# Patient Record
Sex: Female | Born: 1975 | Race: White | Hispanic: No | Marital: Married | Smoking: Never smoker
Health system: Southern US, Community
[De-identification: ages and names within clinical notes are randomized; demographics above are authoritative.]

## PROBLEM LIST (undated history)

## (undated) DIAGNOSIS — R112 Nausea with vomiting, unspecified: Secondary | ICD-10-CM

## (undated) DIAGNOSIS — D649 Anemia, unspecified: Secondary | ICD-10-CM

## (undated) DIAGNOSIS — Z9889 Other specified postprocedural states: Secondary | ICD-10-CM

## (undated) HISTORY — PX: TUBAL LIGATION: SHX77

---

## 2001-04-03 ENCOUNTER — Other Ambulatory Visit: Admission: RE | Admit: 2001-04-03 | Discharge: 2001-04-03 | Payer: Self-pay | Admitting: Gynecology

## 2003-04-29 ENCOUNTER — Encounter: Admission: RE | Admit: 2003-04-29 | Discharge: 2003-07-28 | Payer: Self-pay | Admitting: General Surgery

## 2003-08-11 ENCOUNTER — Emergency Department (HOSPITAL_COMMUNITY): Admission: EM | Admit: 2003-08-11 | Discharge: 2003-08-11 | Payer: Self-pay | Admitting: Emergency Medicine

## 2004-06-07 HISTORY — PX: CHOLECYSTECTOMY: SHX55

## 2010-06-07 HISTORY — PX: HERNIA REPAIR: SHX51

## 2010-06-27 ENCOUNTER — Encounter: Payer: Self-pay | Admitting: General Surgery

## 2012-08-15 ENCOUNTER — Other Ambulatory Visit: Payer: Self-pay | Admitting: Obstetrics and Gynecology

## 2012-08-15 ENCOUNTER — Other Ambulatory Visit (HOSPITAL_COMMUNITY)
Admission: RE | Admit: 2012-08-15 | Discharge: 2012-08-15 | Disposition: A | Discharge status: Home / Self Care | Payer: PRIVATE HEALTH INSURANCE | Source: Ambulatory Visit | Attending: Obstetrics and Gynecology | Admitting: Obstetrics and Gynecology

## 2012-08-15 ENCOUNTER — Ambulatory Visit
Admission: RE | Admit: 2012-08-15 | Discharge: 2012-08-15 | Disposition: A | Discharge status: Home / Self Care | Payer: PRIVATE HEALTH INSURANCE | Source: Ambulatory Visit | Attending: Obstetrics and Gynecology | Admitting: Obstetrics and Gynecology

## 2012-08-15 DIAGNOSIS — M79605 Pain in left leg: Secondary | ICD-10-CM

## 2012-08-15 DIAGNOSIS — R609 Edema, unspecified: Secondary | ICD-10-CM

## 2012-08-15 DIAGNOSIS — Z01419 Encounter for gynecological examination (general) (routine) without abnormal findings: Secondary | ICD-10-CM | POA: Insufficient documentation

## 2012-08-15 DIAGNOSIS — Z1151 Encounter for screening for human papillomavirus (HPV): Secondary | ICD-10-CM | POA: Insufficient documentation

## 2012-08-30 ENCOUNTER — Other Ambulatory Visit: Payer: Self-pay | Admitting: Obstetrics and Gynecology

## 2013-02-13 ENCOUNTER — Other Ambulatory Visit: Payer: Self-pay | Admitting: Obstetrics and Gynecology

## 2013-02-13 DIAGNOSIS — N926 Irregular menstruation, unspecified: Secondary | ICD-10-CM

## 2013-02-14 ENCOUNTER — Ambulatory Visit
Admission: RE | Admit: 2013-02-14 | Discharge: 2013-02-14 | Disposition: A | Discharge status: Home / Self Care | Payer: PRIVATE HEALTH INSURANCE | Source: Ambulatory Visit | Attending: Obstetrics and Gynecology | Admitting: Obstetrics and Gynecology

## 2013-02-14 DIAGNOSIS — N926 Irregular menstruation, unspecified: Secondary | ICD-10-CM

## 2013-03-02 ENCOUNTER — Encounter (HOSPITAL_COMMUNITY): Payer: Self-pay | Admitting: Pharmacist

## 2013-03-06 ENCOUNTER — Encounter (HOSPITAL_COMMUNITY): Payer: Self-pay | Admitting: Anesthesiology

## 2013-03-06 ENCOUNTER — Ambulatory Visit (HOSPITAL_COMMUNITY)
Admission: RE | Admit: 2013-03-06 | Discharge: 2013-03-06 | Disposition: A | Discharge status: Home / Self Care | Payer: Self-pay | Source: Ambulatory Visit | Attending: Obstetrics and Gynecology | Admitting: Obstetrics and Gynecology

## 2013-03-06 ENCOUNTER — Ambulatory Visit (HOSPITAL_COMMUNITY): Payer: PRIVATE HEALTH INSURANCE | Admitting: Anesthesiology

## 2013-03-06 ENCOUNTER — Encounter (HOSPITAL_COMMUNITY)
Admission: RE | Disposition: A | Discharge status: Home / Self Care | Payer: Self-pay | Source: Ambulatory Visit | Attending: Obstetrics and Gynecology

## 2013-03-06 DIAGNOSIS — N938 Other specified abnormal uterine and vaginal bleeding: Secondary | ICD-10-CM | POA: Insufficient documentation

## 2013-03-06 DIAGNOSIS — N920 Excessive and frequent menstruation with regular cycle: Secondary | ICD-10-CM | POA: Insufficient documentation

## 2013-03-06 DIAGNOSIS — N949 Unspecified condition associated with female genital organs and menstrual cycle: Secondary | ICD-10-CM | POA: Insufficient documentation

## 2013-03-06 DIAGNOSIS — N946 Dysmenorrhea, unspecified: Secondary | ICD-10-CM | POA: Insufficient documentation

## 2013-03-06 HISTORY — DX: Anemia, unspecified: D64.9

## 2013-03-06 HISTORY — PX: HYSTEROSCOPY W/D&C: SHX1775

## 2013-03-06 HISTORY — DX: Other specified postprocedural states: R11.2

## 2013-03-06 HISTORY — DX: Other specified postprocedural states: Z98.890

## 2013-03-06 LAB — CBC
HCT: 31.4 % — ABNORMAL LOW (ref 36.0–46.0)
MCH: 29.6 pg (ref 26.0–34.0)
MCHC: 32.2 g/dL (ref 30.0–36.0)
RDW: 14.8 % (ref 11.5–15.5)
WBC: 15.9 10*3/uL — ABNORMAL HIGH (ref 4.0–10.5)

## 2013-03-06 LAB — PREGNANCY, URINE: Preg Test, Ur: NEGATIVE

## 2013-03-06 SURGERY — DILATATION AND CURETTAGE /HYSTEROSCOPY
Anesthesia: General | Site: Vagina | Wound class: Clean Contaminated

## 2013-03-06 MED ORDER — ONDANSETRON HCL 4 MG/2ML IJ SOLN
INTRAMUSCULAR | Status: DC | PRN
  Administered 2013-03-06: 4 mg via INTRAVENOUS

## 2013-03-06 MED ORDER — LIDOCAINE HCL (CARDIAC) 20 MG/ML IV SOLN
INTRAVENOUS | Status: AC
  Filled 2013-03-06: qty 5

## 2013-03-06 MED ORDER — LIDOCAINE HCL (CARDIAC) 20 MG/ML IV SOLN
INTRAVENOUS | Status: DC | PRN
  Administered 2013-03-06: 70 mg via INTRAVENOUS

## 2013-03-06 MED ORDER — LACTATED RINGERS IV SOLN
INTRAVENOUS | Status: DC
  Administered 2013-03-06 (×2): via INTRAVENOUS

## 2013-03-06 MED ORDER — MIDAZOLAM HCL 2 MG/2ML IJ SOLN
INTRAMUSCULAR | Status: AC
  Filled 2013-03-06: qty 2

## 2013-03-06 MED ORDER — DEXAMETHASONE SODIUM PHOSPHATE 10 MG/ML IJ SOLN
INTRAMUSCULAR | Status: DC | PRN
  Administered 2013-03-06: 10 mg via INTRAVENOUS

## 2013-03-06 MED ORDER — SILVER NITRATE-POT NITRATE 75-25 % EX MISC
CUTANEOUS | Status: AC
  Filled 2013-03-06: qty 2

## 2013-03-06 MED ORDER — METOCLOPRAMIDE HCL 5 MG/ML IJ SOLN: 10.0000 mg | Freq: Once | INTRAMUSCULAR | Status: DC | PRN

## 2013-03-06 MED ORDER — FENTANYL CITRATE 0.05 MG/ML IJ SOLN
INTRAMUSCULAR | Status: AC
  Filled 2013-03-06: qty 2

## 2013-03-06 MED ORDER — SCOPOLAMINE 1 MG/3DAYS TD PT72
MEDICATED_PATCH | TRANSDERMAL | Status: AC
  Filled 2013-03-06: qty 1

## 2013-03-06 MED ORDER — PROPOFOL 10 MG/ML IV EMUL
INTRAVENOUS | Status: AC
  Filled 2013-03-06: qty 20

## 2013-03-06 MED ORDER — SCOPOLAMINE 1 MG/3DAYS TD PT72: 1.0000 | MEDICATED_PATCH | TRANSDERMAL | Status: DC

## 2013-03-06 MED ORDER — FENTANYL CITRATE 0.05 MG/ML IJ SOLN
25.0000 ug | INTRAMUSCULAR | Status: DC | PRN
  Administered 2013-03-06: 50 ug via INTRAVENOUS

## 2013-03-06 MED ORDER — LIDOCAINE HCL 2 % IJ SOLN
INTRAMUSCULAR | Status: DC | PRN
  Administered 2013-03-06: 10 mL

## 2013-03-06 MED ORDER — DEXTROSE 5 % IV SOLN
3.0000 g | INTRAVENOUS | Status: AC
  Administered 2013-03-06: 3 g via INTRAVENOUS
  Filled 2013-03-06: qty 3000

## 2013-03-06 MED ORDER — DEXAMETHASONE SODIUM PHOSPHATE 10 MG/ML IJ SOLN
INTRAMUSCULAR | Status: AC
  Filled 2013-03-06: qty 1

## 2013-03-06 MED ORDER — FENTANYL CITRATE 0.05 MG/ML IJ SOLN
INTRAMUSCULAR | Status: DC | PRN
  Administered 2013-03-06 (×2): 50 ug via INTRAVENOUS

## 2013-03-06 MED ORDER — MIDAZOLAM HCL 2 MG/2ML IJ SOLN
INTRAMUSCULAR | Status: DC | PRN
  Administered 2013-03-06: 1 mg via INTRAVENOUS

## 2013-03-06 MED ORDER — GLYCINE 1.5 % IR SOLN
Status: DC | PRN
  Administered 2013-03-06: 3000 mL

## 2013-03-06 MED ORDER — SODIUM CHLORIDE 0.9 % IR SOLN: Status: DC | PRN

## 2013-03-06 MED ORDER — PROPOFOL 10 MG/ML IV BOLUS
INTRAVENOUS | Status: DC | PRN
  Administered 2013-03-06: 180 mg via INTRAVENOUS

## 2013-03-06 MED ORDER — HYDROCODONE-ACETAMINOPHEN 5-325 MG PO TABS: 1.0000 | ORAL_TABLET | Freq: Four times a day (QID) | ORAL | Status: DC | PRN

## 2013-03-06 MED ORDER — KETOROLAC TROMETHAMINE 30 MG/ML IJ SOLN
INTRAMUSCULAR | Status: DC | PRN
  Administered 2013-03-06: 30 mg via INTRAVENOUS

## 2013-03-06 MED ORDER — KETOROLAC TROMETHAMINE 30 MG/ML IJ SOLN: 15.0000 mg | Freq: Once | INTRAMUSCULAR | Status: DC | PRN

## 2013-03-06 MED ORDER — FENTANYL CITRATE 0.05 MG/ML IJ SOLN
INTRAMUSCULAR | Status: AC
  Administered 2013-03-06: 50 ug via INTRAVENOUS
  Filled 2013-03-06: qty 2

## 2013-03-06 MED ORDER — LIDOCAINE HCL 2 % IJ SOLN
INTRAMUSCULAR | Status: AC
  Filled 2013-03-06: qty 20

## 2013-03-06 MED ORDER — KETOROLAC TROMETHAMINE 30 MG/ML IJ SOLN
INTRAMUSCULAR | Status: AC
  Filled 2013-03-06: qty 1

## 2013-03-06 MED ORDER — ONDANSETRON HCL 4 MG/2ML IJ SOLN
INTRAMUSCULAR | Status: AC
  Filled 2013-03-06: qty 2

## 2013-03-06 SURGICAL SUPPLY — 16 items
CANISTER SUCTION 2500CC (MISCELLANEOUS) ×2 IMPLANT
CATH FOLEY LATEX FREE 14FR (CATHETERS) ×1
CATH FOLEY LF 14FR (CATHETERS) ×1 IMPLANT
CATH ROBINSON RED A/P 16FR (CATHETERS) ×2 IMPLANT
CLOTH BEACON ORANGE TIMEOUT ST (SAFETY) ×2 IMPLANT
CONTAINER PREFILL 10% NBF 60ML (FORM) ×4 IMPLANT
DILATOR CANAL MILEX (MISCELLANEOUS) IMPLANT
DRESSING TELFA 8X3 (GAUZE/BANDAGES/DRESSINGS) ×2 IMPLANT
GLOVE BIO SURGEON STRL SZ7 (GLOVE) ×2 IMPLANT
GLOVE BIOGEL PI IND STRL 7.0 (GLOVE) ×1 IMPLANT
GLOVE BIOGEL PI INDICATOR 7.0 (GLOVE) ×1
GOWN STRL REIN XL XLG (GOWN DISPOSABLE) ×4 IMPLANT
PACK HYSTEROSCOPY LF (CUSTOM PROCEDURE TRAY) ×2 IMPLANT
PAD OB MATERNITY 4.3X12.25 (PERSONAL CARE ITEMS) ×2 IMPLANT
TOWEL OR 17X24 6PK STRL BLUE (TOWEL DISPOSABLE) ×4 IMPLANT
WATER STERILE IRR 1000ML POUR (IV SOLUTION) ×2 IMPLANT

## 2013-03-06 NOTE — H&P (Signed)
History of Present Illness  General:  37 y/o morbidly obese female with DUB presents with compliaint of heavy bleeding. Would like to proceed with D&C ASAP. Changing pads every 1 per hour prior to increasing Provera to 20 mg 2 days ago. Now only changing once every 3 hours yesterdays. +Clots, golf ball sized. + Flooding. Fatigued, SOB, Dizzy. Hg yesterday was 9.9. On Provera 10 mg x 2.5 weeks, since 02/13/13. Has had times where blood is running down her leg. Desires to proceed with Thermochoice ablation. Pt just found out she is uninsured and is aware that cost will be due upfront. Would like to talk with her husband first.  Current Medications  Taking Multivitamins Tablet   Taking Naproxen Sodium 220 MG Tablet 1 tablet as needed every 12 hrs, Notes: prn for headaches  Taking Provera 20 Tablet 1 table One tablet daiy  Taking Integra 62.5-62.5-40-3 MG Capsule 1 capsule Once a day  Not-Taking/PRN ibuprofen 1 tab , Notes: prn  Not-Taking/PRN Tylenol 1 tab , Notes: prn  Medication List reviewed and reconciled with the patient  Past Medical History  No Medical History.  Surgical History  c-section x 2   gallbladder removed   incisional hernia repaired   BTL   Family History  Father: alive, skin cancer  Mother: alive, MI  Maternal Grand Mother: deceased, diagnosed with Breast Ca  Social History  General:  Tobacco use  cigarettes: Never smoked no Smoking.  Alcohol: occasionally.  no Recreational drug use.  Exercise: minimal.  Occupation: House wife.  Marital Status: married.  Children: girls, 2.   Gyn History  Sexual activity currently sexually active.  Periods : irregular.  LMP started in July and has not stopped, some days are very heavy with lots of clotting and other days lighter.  Birth control BTL.  Last pap smear date 08/15/12, neg.  Last mammogram date within the last two years.  Abnormal pap smear none.  STD none.  GYN procedures 08/30/12, Endometrial Biopsy, benign.    OB History  Number of pregnancies 2.  Pregnancy # 1 live birth, C-section delivery, girl.  Pregnancy # 2 live birth, C-section, girl.   Allergies  Dilaudid: hives  Latex Gloves  Hospitalization/Major Diagnostic Procedure  childbirth x 2   Review of Systems  See HPI.  Vital Signs  Wt 372, Ht 65.5, BMI 60.96, BP sitting 154/90.  Physical Examination  GENERAL:  Patient appears alert and oriented.  General Appearance: well-appearing, well-developed, no acute distress.  Speech: clear.  LUNGS:  Auscultation: CTA bilaterally, no wheezing/rhonchi/rales.  HEART:  Heart sounds: normal, RRR.  ABDOMEN:  General: moibidly obese, nontender.  FEMALE GENITOURINARY:  Cervix visualized, healthy appearing, no discharge, no lesions.  Adnexa: no mass, non tender.  Uterus: normal size/shape/consistency, freely mobile, non tender.  Vagina: pink/moist mucosa, no lesions, no abnormal discharge, min-moderate blood in vault..  Vulva: normal, no lesions, no skin discoloration.  Anus: no external hemosrhoids.  EXTREMITIES:  general no edema.    Assessments  1. Pre-op exam - V72.84 (Primary)  2. Menorrhagia - 626.2  3. DUB (dysfunctional uterine bleeding) - 626.8  4. Dysmenorrhea - 625.3  Treatment  1. Pre-op exam  Notes: Proceed with D&C to rule out endometrial hyperplasia and/or cancer. Also can be therapeutic. Pt desires to proceed ASAP. Instructed to go to ER for symptomatica anemia.    2. Menorrhagia  LAB: CBC with Diff (Ordered for 02/28/2013) LAB: Ferritin (Ordered for 02/28/2013) LAB: Iron, Serum (Ordered for 02/28/2013)   3.  DUB (dysfunctional uterine bleeding)  Start Provera Tablet, 10 MG, 3 tablets, Orally, Once a day, 20 days, 60 Tablet, Refills 0   4. Dysmenorrhea  Start Ibuprofen Tablet, 800 MG, 1 tablet, Orally, every 8 hrs x 24 hours prior to menses then q 8 hours prn pain, 30 day(s), 30, Refills 2      Labs  Lab: CBC with Diff (Ordered for 02/28/2013) Hg 9.9  WBC 13.6 H  4.0-11.0 - K/ul  RBC 3.37 L 4.20-5.40 - M/uL  HGB 9.9 L 12.0-16.0 - g/dL  HCT 74.2 L 59.5-63.8 - %  MCH 29.4  27.0-33.0 - pg  MPV 8.9  7.5-10.7 - fL  MCV 91.8  81.0-99.0 - fL  MCHC 32.0  32.0-36.0 - g/dL  RDW 75.6  43.3-29.5 - %  NRBC# 0.00  -   PLT 297  150-400 - K/uL  NEUT % 65.1  43.3-71.9 - %  NRBC% 0.00  - %  LYMPH% 25.2  16.8-43.5 - %  MONO % 7.9  4.6-12.4 - %  EOS % 1.6  0.0-7.8 - %  BASO % 0.2  0.0-1.0 - %  NEUT # 8.9 H 1.9-7.2 - K/uL  LYMPH# 3.40 H 1.10-2.70 - K/uL  MONO # 1.1 H 0.3-0.8 - K/uL  EOS # 0.2  0.0-0.6 - K/uL  BASO # 0.0  0.0-0.1 - K/uL            Lab: Ferritin (Ordered for 02/28/2013)  FERRITIN 26.6  23.9-336.2 - ng/mL            Lab: Iron, Serum (Ordered for 02/28/2013)  IR 60   50-212 - ug/dL             Follow Up  2 Weeks post op

## 2013-03-06 NOTE — Transfer of Care (Signed)
Immediate Anesthesia Transfer of Care Note  Patient: Candice Gillespie  Procedure(s) Performed: Procedure(s): DILATATION AND CURETTAGE /HYSTEROSCOPY (N/A)  Patient Location: PACU  Anesthesia Type:General  Level of Consciousness: awake, oriented and patient cooperative  Airway & Oxygen Therapy: Patient Spontanous Breathing and Patient connected to nasal cannula oxygen  Post-op Assessment: Report given to PACU RN and Post -op Vital signs reviewed and stable  Post vital signs: Reviewed and stable  Complications: No apparent anesthesia complications

## 2013-03-06 NOTE — Anesthesia Preprocedure Evaluation (Addendum)
Anesthesia Evaluation  Patient identified by MRN, date of birth, ID band Patient awake    Reviewed: Allergy & Precautions, H&P , NPO status , Patient's Chart, lab work & pertinent test results, reviewed documented beta blocker date and time   History of Anesthesia Complications (+) PONV  Airway Mallampati: II TM Distance: >3 FB Neck ROM: full    Dental  (+) Teeth Intact   Pulmonary neg pulmonary ROS,  breath sounds clear to auscultation  Pulmonary exam normal       Cardiovascular negative cardio ROS  Rhythm:regular Rate:Normal     Neuro/Psych negative neurological ROS  negative psych ROS   GI/Hepatic negative GI ROS, Neg liver ROS,   Endo/Other  Morbid obesity  Renal/GU negative Renal ROS  Female GU complaint     Musculoskeletal   Abdominal   Peds  Hematology  (+) anemia ,   Anesthesia Other Findings Allergic to dilaudid  Reproductive/Obstetrics negative OB ROS                          Anesthesia Physical Anesthesia Plan  ASA: III  Anesthesia Plan: General LMA   Post-op Pain Management:    Induction:   Airway Management Planned:   Additional Equipment:   Intra-op Plan:   Post-operative Plan:   Informed Consent: I have reviewed the patients History and Physical, chart, labs and discussed the procedure including the risks, benefits and alternatives for the proposed anesthesia with the patient or authorized representative who has indicated his/her understanding and acceptance.   Dental Advisory Given  Plan Discussed with: CRNA and Surgeon  Anesthesia Plan Comments:        Anesthesia Quick Evaluation

## 2013-03-06 NOTE — Brief Op Note (Signed)
03/06/2013  8:22 AM  PATIENT:  Trula Slade  37 y.o. female  PRE-OPERATIVE DIAGNOSIS:  ABNORMAL BLEEDING, Morbid obesity  POST-OPERATIVE DIAGNOSIS:  Same  PROCEDURE:  Procedure(s): DILATATION AND CURETTAGE /HYSTEROSCOPY (N/A)  SURGEON:  Surgeon(s) and Role:    * Geryl Rankins, MD - Primary  PHYSICIAN ASSISTANT:   ASSISTANTS: Technician   ANESTHESIA:   local and general  EBL:  Total I/O In: 1300 [I.V.:1300] Out: 25 [Urine:25]  BLOOD ADMINISTERED:none  DRAINS: none   LOCAL MEDICATIONS USED: 2% XYLOCAINE 10 cc  SPECIMEN:  Source of Specimen:  endometrial currettings  DISPOSITION OF SPECIMEN:  PATHOLOGY  COUNTS:  YES  TOURNIQUET:  * No tourniquets in log *  DICTATION: .Other Dictation: Dictation Number [id number]  PLAN OF CARE: Discharge to home after PACU  PATIENT DISPOSITION:  PACU - hemodynamically stable.   Delay start of Pharmacological VTE agent (>24hrs) due to surgical blood loss or risk of bleeding: not applicable

## 2013-03-06 NOTE — Anesthesia Postprocedure Evaluation (Signed)
Anesthesia Post Note  Patient: Museum/gallery conservator  Procedure(s) Performed: Procedure(s) (LRB): DILATATION AND CURETTAGE /HYSTEROSCOPY (N/A)  Anesthesia type: General  Patient location: PACU  Post pain: Pain level controlled  Post assessment: Post-op Vital signs reviewed  Last Vitals:  Filed Vitals:   03/06/13 0944  BP:   Pulse: 72  Temp: 36.6 C  Resp: 16    Post vital signs: Reviewed  Level of consciousness: sedated  Complications: No apparent anesthesia complications

## 2013-03-06 NOTE — Interval H&P Note (Signed)
History and Physical Interval Note:  03/06/2013 7:25 AM  Candice Gillespie  has presented today for surgery, with the diagnosis of ABNORMAL BLEEDING   The various methods of treatment have been discussed with the patient and family. After consideration of risks, benefits and other options for treatment, the patient has consented to  Procedure(s): DILATATION AND CURETTAGE /HYSTEROSCOPY (N/A) as a surgical intervention .  The patient's history has been reviewed, patient examined, no change in status, stable for surgery.  I have reviewed the patient's chart and labs.  Questions were answered to the patient's satisfaction.    Pt states bleeding has slowed down.  Dion Body, Thereasa Iannello

## 2013-03-06 NOTE — Addendum Note (Signed)
Addendum created 03/06/13 1006 by Suella Grove, CRNA   Modules edited: Anesthesia Medication Administration

## 2013-03-07 ENCOUNTER — Encounter (HOSPITAL_COMMUNITY): Payer: Self-pay | Admitting: Obstetrics and Gynecology

## 2013-03-07 NOTE — Op Note (Signed)
Candice Gillespie, LOTTER NO.:  [id number]  MEDICAL RECORD NO.:  [id number]  LOCATION:  [facility]                          FACILITY:  [facility]  PHYSICIAN:  Pieter Partridge, MD   DATE OF BIRTH:  1975-07-05  DATE OF PROCEDURE:  03/06/2013 DATE OF DISCHARGE:  03/06/2013                              OPERATIVE REPORT   PREOPERATIVE DIAGNOSES:  Abnormal uterine bleeding, morbid obesity.  POSTOPERATIVE DIAGNOSES:  Abnormal uterine bleeding, morbid obesity.  PROCEDURE:  Hysteroscopy, dilation and curettage.  ASSISTANT:  Pensions consultant.  ANESTHESIA:  Local and general.  IV FLUIDS:  In 1300.  Out of urine prior to procedure is 25 mL.  ANESTHESIA:  Local 2% Xylocaine 10 mL.  SPECIMEN:  Endometrial curettings to Pathology.  FINDINGS:  Unable to distend the endometrial cavity due to length of hysteroscope, however, appears to be copious amount of endometrial tissue.  DISPOSITION:  To PACU hemodynamically stable.  COMPLICATIONS:  None.  PROCEDURE IN DETAIL:  [name] was identified in the holding area. She was then taken to the operating room, where she was placed in the dorsal supine position and underwent general anesthesia without complication.  She was then placed in the dorsal lithotomy position and prepped and draped in normal sterile fashion.  After a time-out was taken, a long Graves speculum was inserted.  The cervix was then identified and grasped with a single-tooth tenaculum.  Betadine was used to clean off the cervix 3 times.  Paracervical block was performed with 10 mL of Xylocaine 2%.  The Hasson was then used to open the cervix and dilated up to a [employer] 19.  Hysteroscope was then advanced.  Because of the patient's habitus, I used the extra long instruments, unfortunately hysteroscope was not extra long.  The patient was sharply anteverted and I was unable to reach to the fundus to adequately distend the uterine fundus, however, just from the appearance, there  appeared to be a lot of endometrial tissue.  The hysteroscope was then removed.  Sharp curettage was performed of all 4 quadrants and I even used polyp forceps to grasp some of the tissue, which was fruitful.  All instruments were then removed from the vagina. The single-tooth tenaculum site was hemostatic and no silver nitrate was used.  All instruments, sponge, and needle counts were correct x3.  The patient was taken to the operating room and went to the PACU in stable condition.  She had SCDs on throughout the case while operating and she received cefotetan 3 g prior to procedure.     Pieter Partridge, MD     EBV/MEDQ  D:  03/06/2013  T:  03/06/2013  Job:  [id number]

## 2013-03-10 ENCOUNTER — Telehealth (HOSPITAL_COMMUNITY): Payer: Self-pay | Admitting: Obstetrics and Gynecology

## 2013-03-10 NOTE — Telephone Encounter (Signed)
TC from patient's husband--had hysteroscopy/D&C on 9/30 due to abnormal uterine bleeding, with large amount of endometrial thickness noted. No complications with procedure. Husband reports patient had soaked through pad and clothing from 1am to 7am, when she got up to BR. Reports she continues to have cramping, he is unsure how much bleeding she is having at present, since she is in the bathroom "cleaning up". Hgb was 10.1 pre-surgery. I advised him I would call back when patient out of shower--he advises me to call in 30 min.

## 2013-03-10 NOTE — Telephone Encounter (Signed)
Addendum/f/u: Spoke with patient at 9:15am, after she had freshened up from bleeding overnight. Small amount bleeding at present.  Mild cramping. Has been taking Vicodin and Ibuprophen prn, but not frequently. Denies dizziness, syncope, fever, dysuria, or other sx. "Just thought I would have less bleeding after procedure". Reviewed status with patient--advised her I recommended she increase rest today, take Ibuprophen on an routine basis (not prn) x 24 hours, push fluids. She will observe bleeding and cramping today and through weekend--if any increase to soaking a pad q 30 min, or if pain increases, she is to call. Advised her she could be seen at any time at [facility] if she had concerns, but that at present, I felt she was stable. Recommended if she did not require evaluation at [facility] over the weekend, she should contact Dr. Dion Body on Monday to give her an update on her status. Patient agreeable with plan.

## 2013-03-19 ENCOUNTER — Ambulatory Visit: Admit: 2013-03-19 | Payer: PRIVATE HEALTH INSURANCE | Admitting: Obstetrics and Gynecology

## 2013-03-19 SURGERY — DILATATION AND CURETTAGE /HYSTEROSCOPY
Anesthesia: Choice

## 2013-04-12 ENCOUNTER — Other Ambulatory Visit: Payer: Self-pay

## 2014-09-29 IMAGING — US US EXTREM LOW VENOUS*L*
1 series · 14 of 24 positions shown · non-contrast
Comparison: None

CLINICAL DATA: LEFT LEG PAIN AND SWELLING ? DVT;;

LEFT LOWER EXTREMITY VENOUS DUPLEX ULTRASOUND
TECHNIQUE: Gray-scale sonography with compression, as well as color
and duplex ultrasound, were performed to evaluate the deep venous
system from the level of the common femoral vein through the
popliteal and proximal calf veins.

[Series 1: us extrem low venous*left* · 14 of 29 slices shown]
[im 1/29]
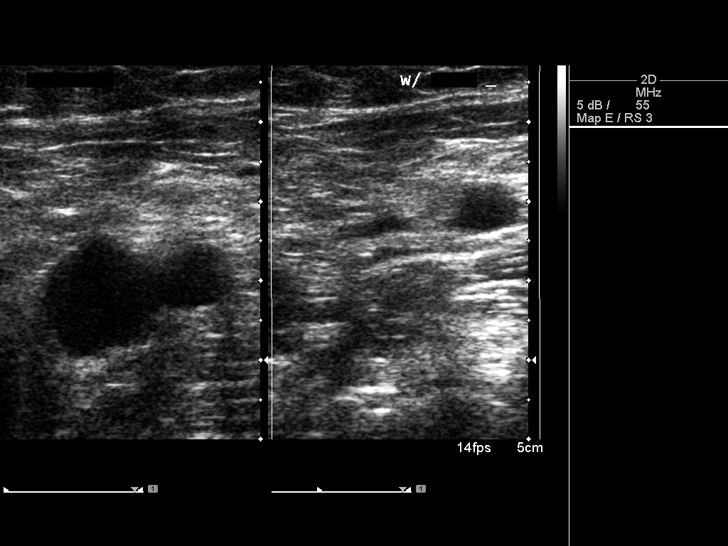
[im 3/29]
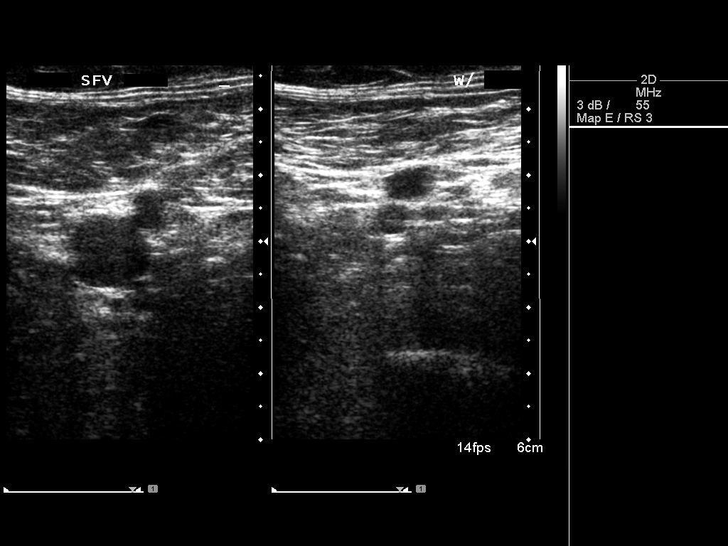
[im 5/29]
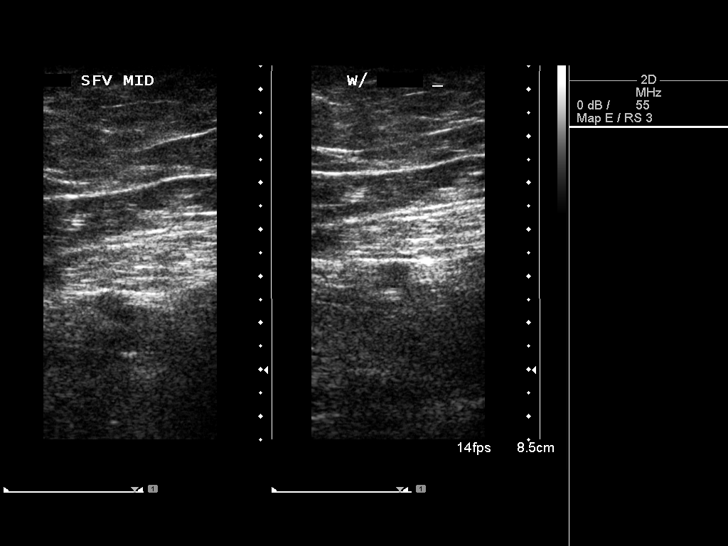
[im 8/29]
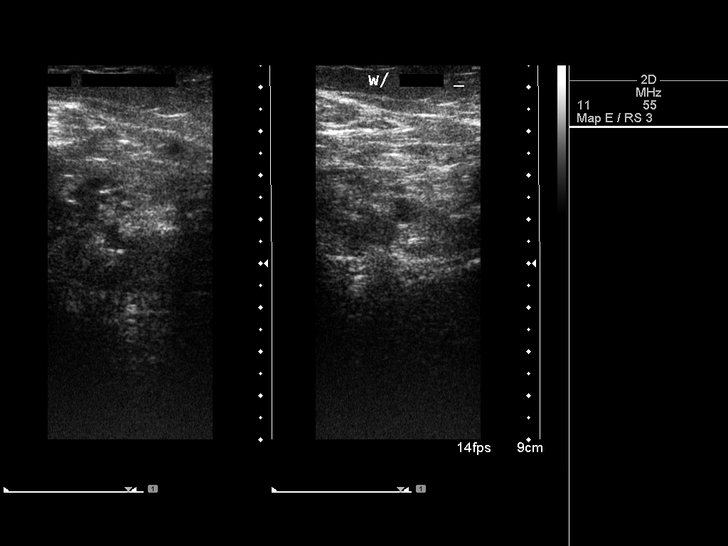
[im 9/29]
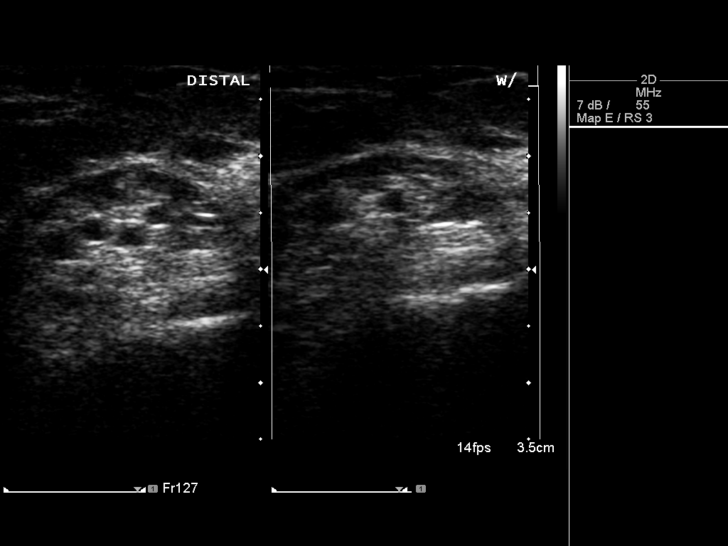
[im 11/29]
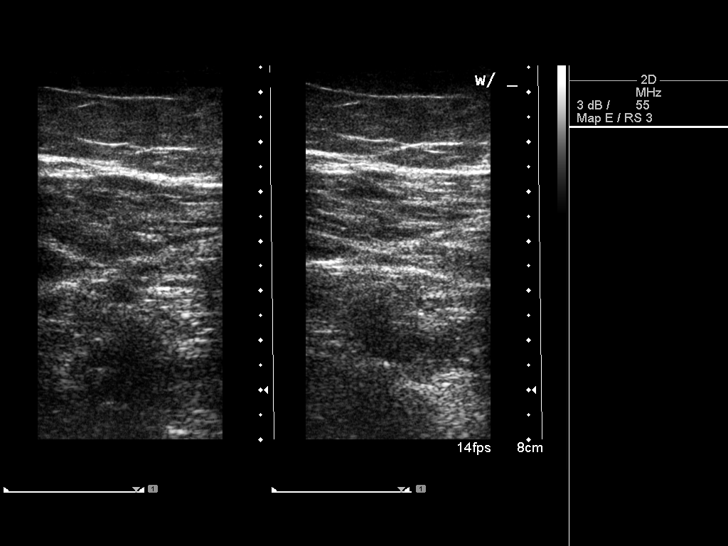
[im 14/29]
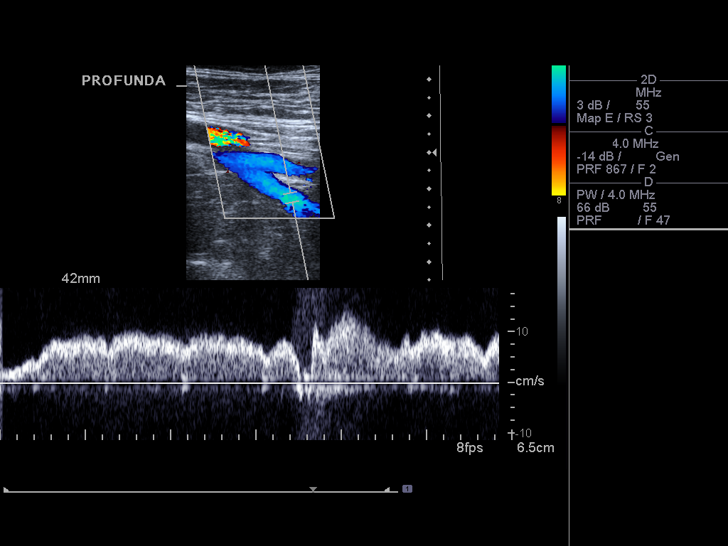
[im 15/29]
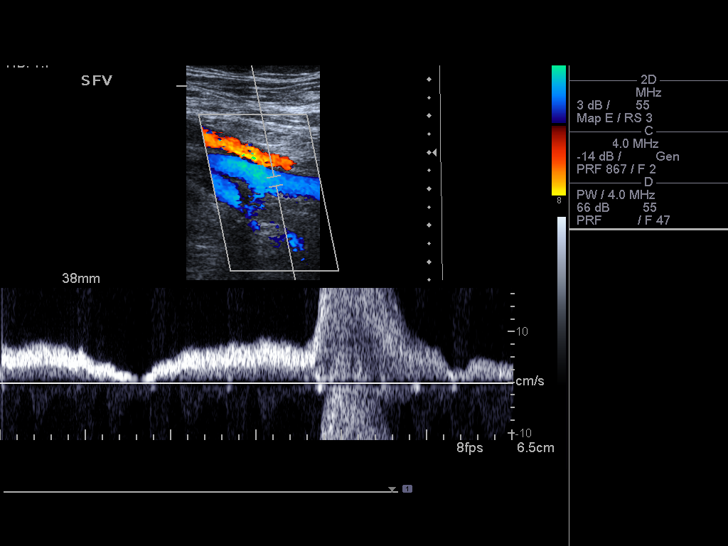
[im 18/29]
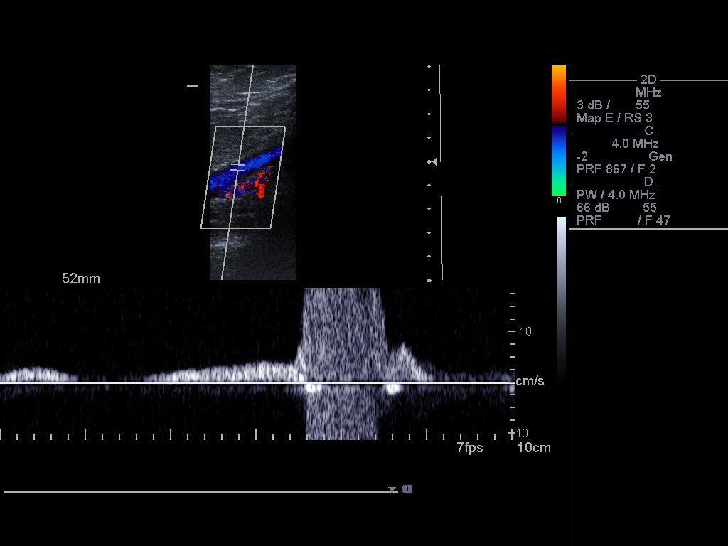
[im 20/29]
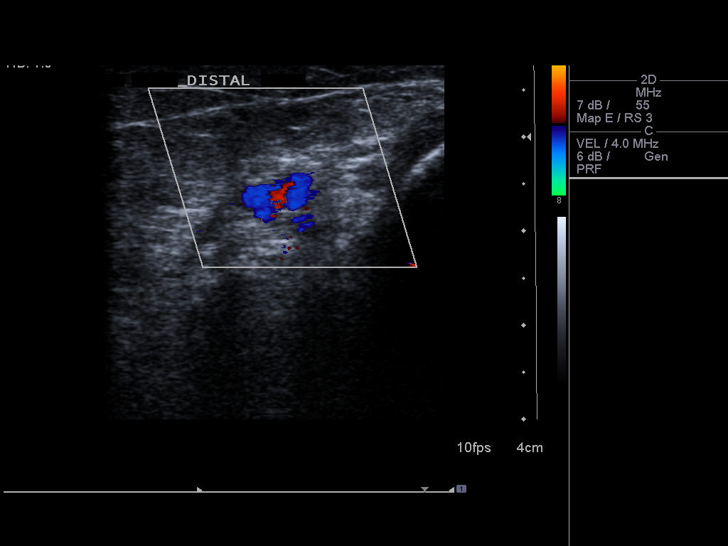
[im 22/29]
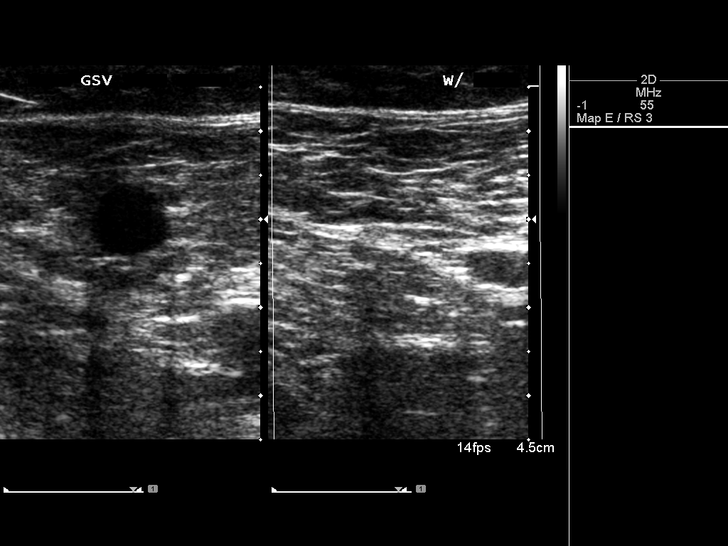
[im 24/29]
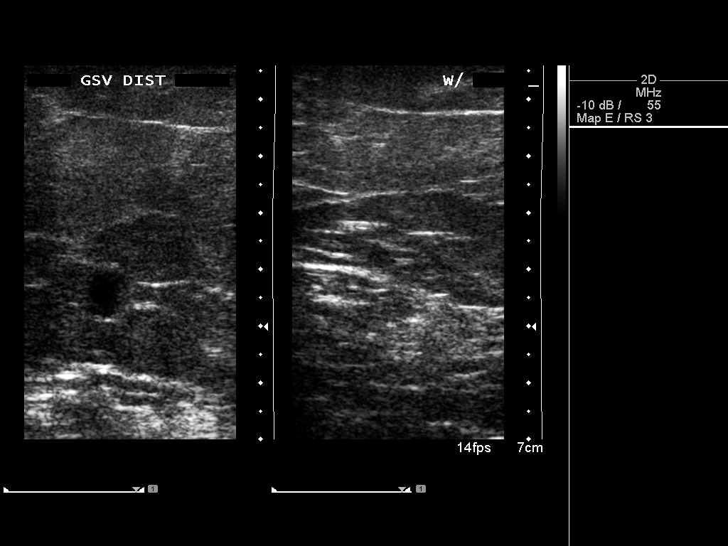
[im 26/29]
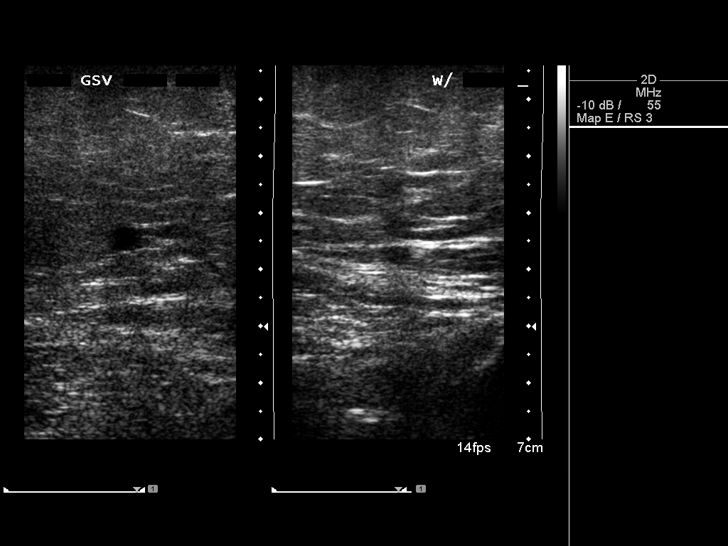
[im 29/29]
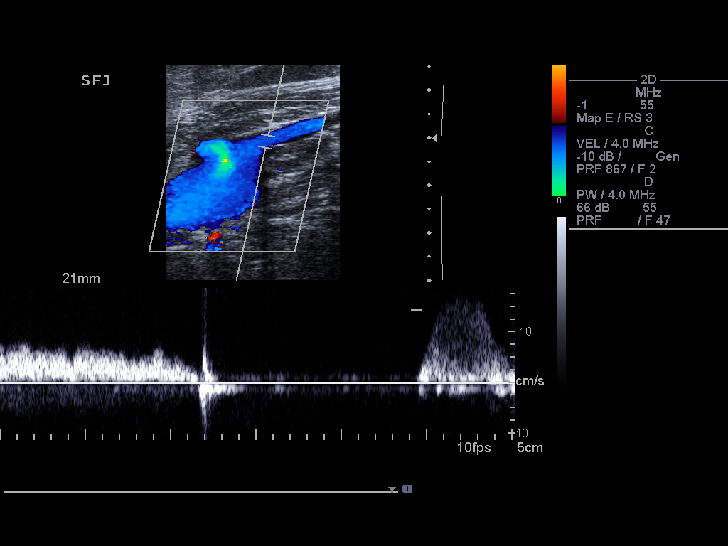

[14 of 24 positions shown; findings below may reference images not displayed]

FINDINGS: Normal compressibility and normal Doppler signal within
the common femoral, superficial femoral and popliteal veins, down
to the proximal calf veins.  No grayscale filling defects to
suggest DVT.
IMPRESSION: No evidence of left lower extremity deep vein thrombosis.

## 2015-01-04 IMAGING — US US PELVIS COMPLETE
1 series · 13 of 25 positions shown · non-contrast
Comparison: None

***ADDENDUM*** CREATED: 02/20/2013 [DATE]

On additional review, the original uterine measurements provided
likely reflects an overestimation.
When manually remeasured, more accurate measurements for the uterus
are 11.1 x 6.4 x 6.8 cm, likely within normal limits.
***END ADDENDUM*** SIGNED BY: Caleb Aujla, M.D.
CLINICAL DATA: Menorrhagia
TRANSABDOMINAL AND TRANSVAGINAL ULTRASOUND OF PELVIS
TECHNIQUE: Both transabdominal and transvaginal ultrasound
examinations of the pelvis were performed. Transabdominal technique
was performed for global imaging of the pelvis including uterus,
ovaries, adnexal regions, and pelvic cul-de-sac.
It was necessary to proceed with endovaginal exam following the
transabdominal exam to visualize the endometrium.

[Series 1: us pelvis complete · 0.42mm/px · 13 of 46 slices shown]
[im 1/46]
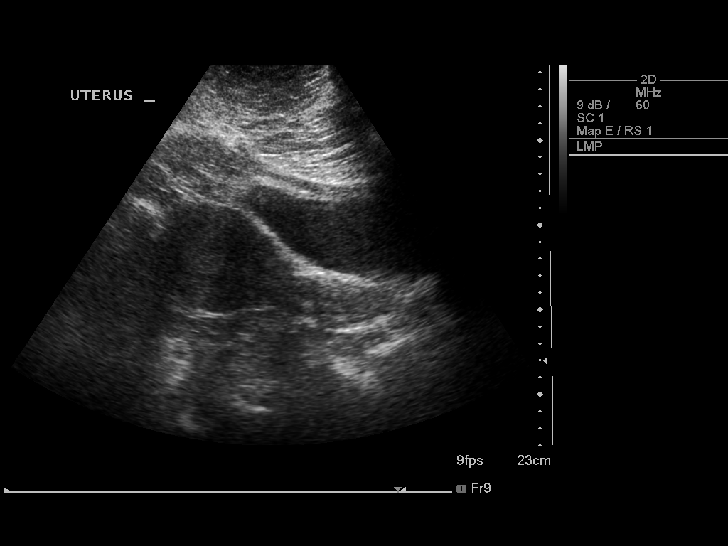
[im 4/46]
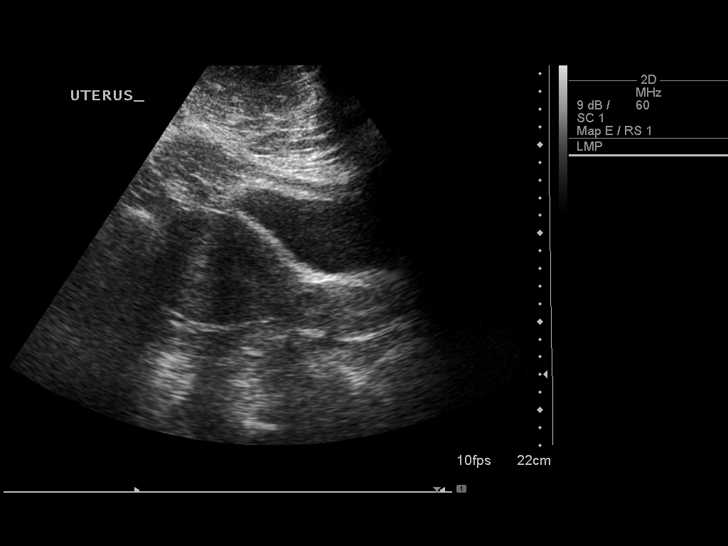
[im 8/46]
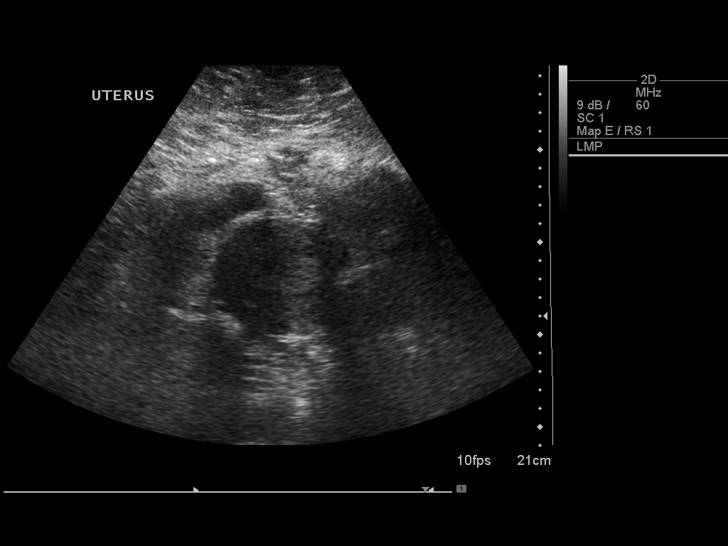
[im 12/46]
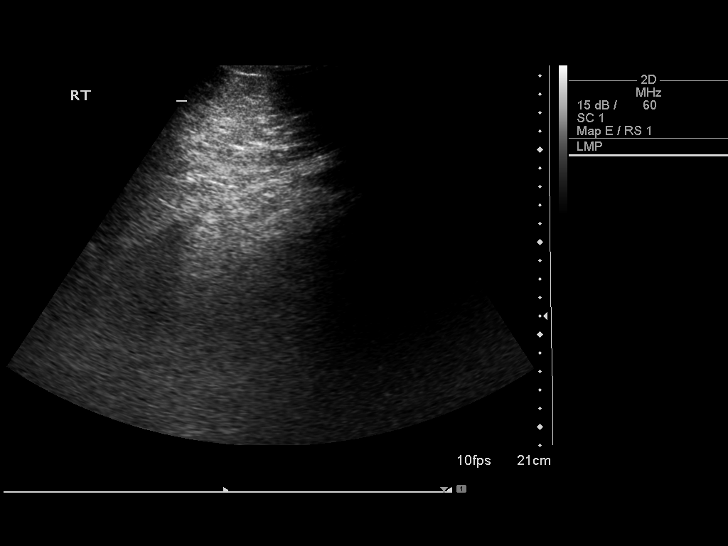
[im 16/46]
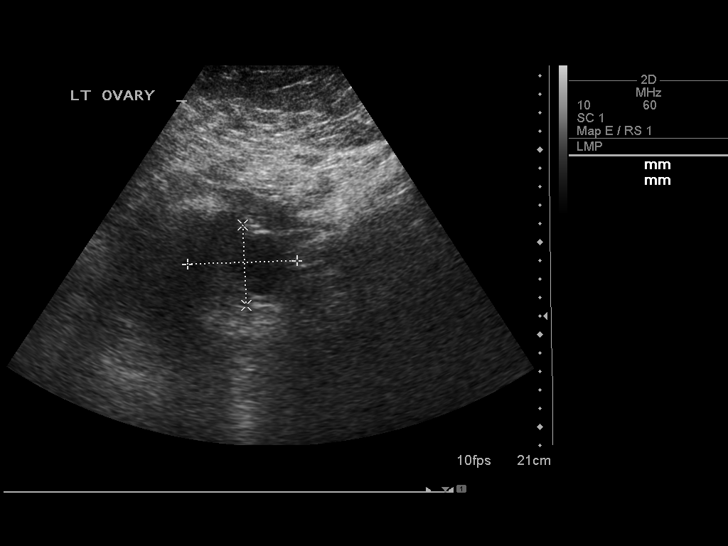
[im 19/46]
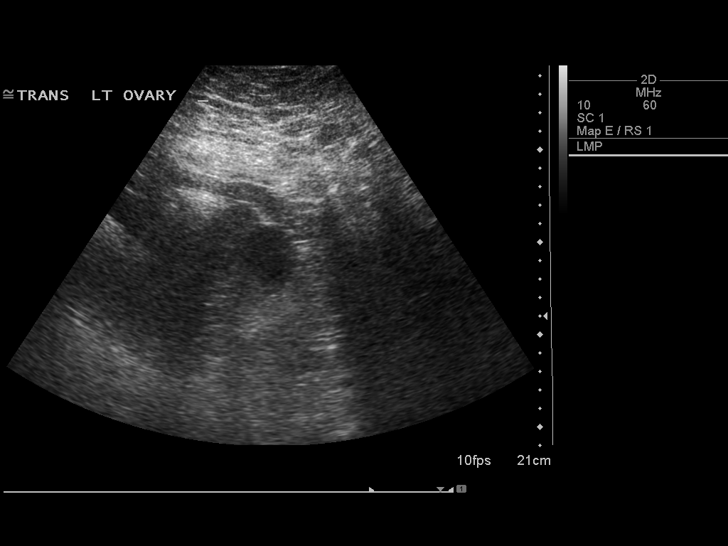
[im 23/46]
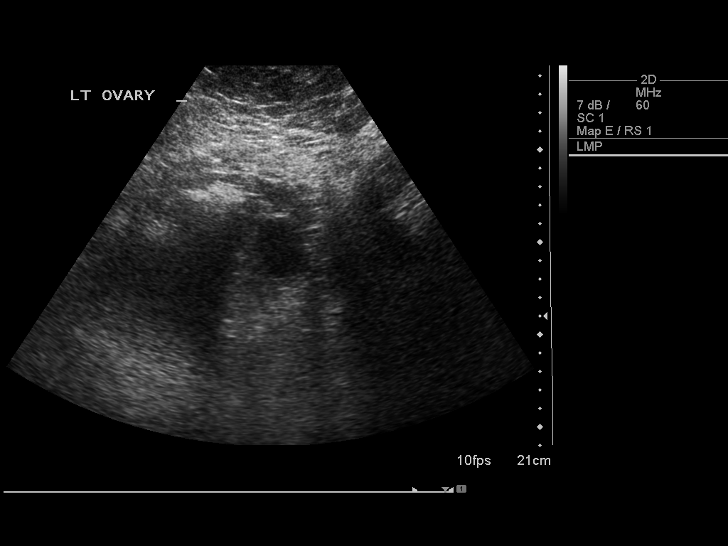
[im 27/46]
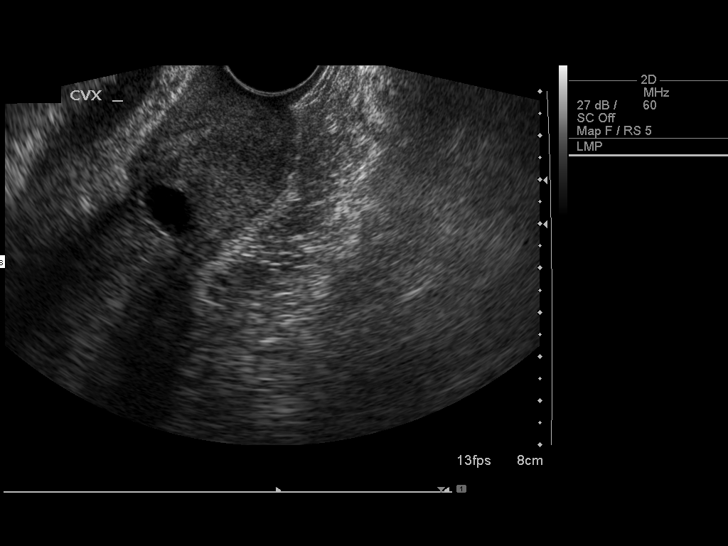
[im 31/46]
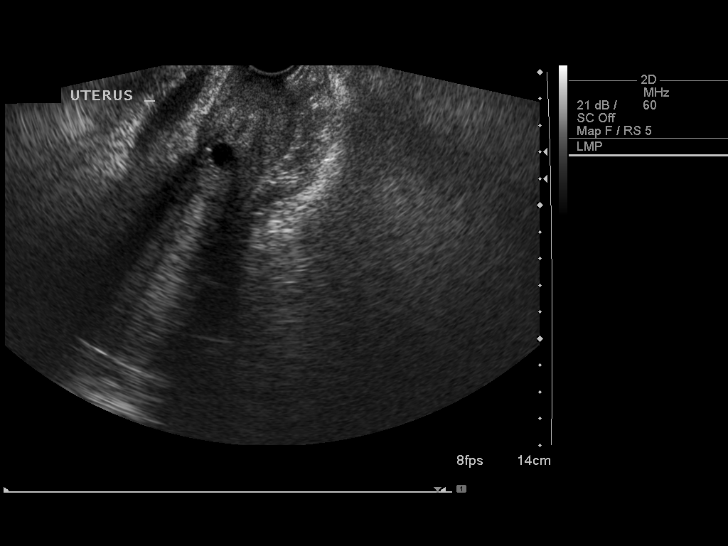
[im 34/46]
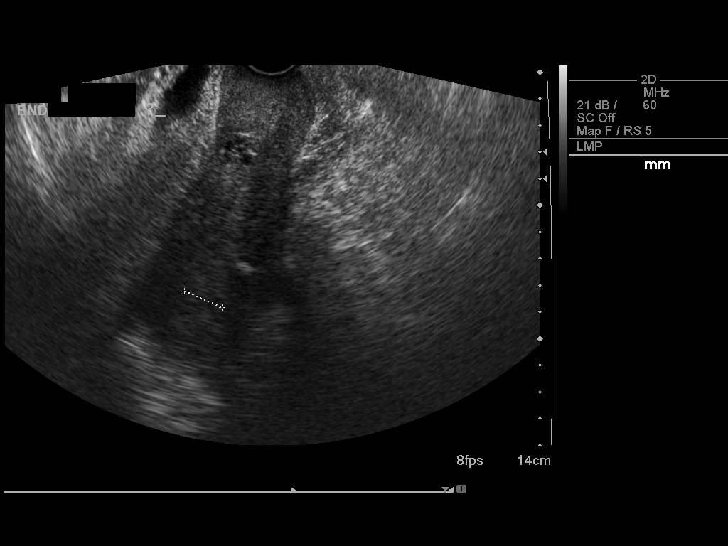
[im 38/46]
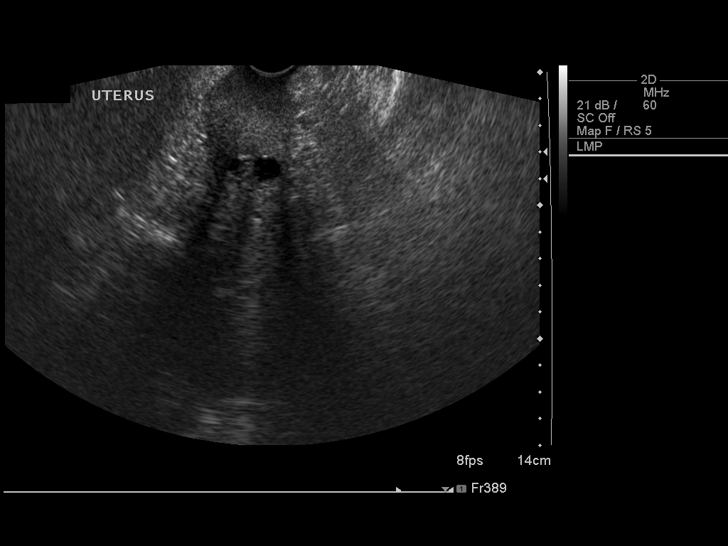
[im 42/46]
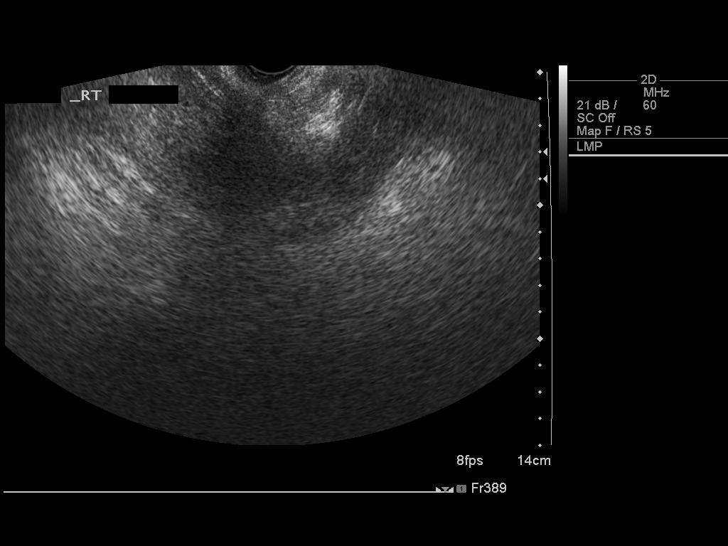
[im 46/46]
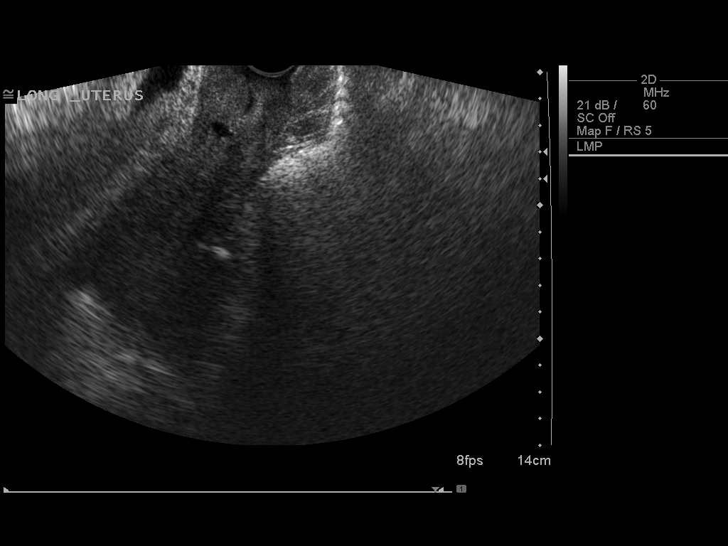

[13 of 25 positions shown; findings below may reference images not displayed]

FINDINGS: Uterus: Normal in size, measuring 15.2 x 6.4 x 6.8 cm.  8 mm
dystrophic uterine calcification (image 37), possibly endometrial.

Endometrium: At the upper limits of normal in thickness, measuring
15 mm.

Right ovary:  Not visualized transabdominally/transvaginally.

Left ovary: Measures 5.9 x 4.4 x 5.3 cm and is notable for a 3.4 x
2.4 x 2.5 cm cyst / follicle.

Other findings: No free fluid.
IMPRESSION: Endometrial complex measures 15 mm, at the upper limits of normal.

3.4 cm left ovarian cyst / follicle, benign.

Right ovary is not discretely visualized.

## 2017-08-31 ENCOUNTER — Other Ambulatory Visit: Payer: Self-pay

## 2017-08-31 ENCOUNTER — Encounter (HOSPITAL_COMMUNITY): Payer: Self-pay | Admitting: Emergency Medicine

## 2017-08-31 DIAGNOSIS — Z5321 Procedure and treatment not carried out due to patient leaving prior to being seen by health care provider: Secondary | ICD-10-CM | POA: Insufficient documentation

## 2017-08-31 DIAGNOSIS — N939 Abnormal uterine and vaginal bleeding, unspecified: Secondary | ICD-10-CM | POA: Insufficient documentation

## 2017-08-31 LAB — BASIC METABOLIC PANEL
Anion gap: 16 — ABNORMAL HIGH (ref 5–15)
BUN: 16 mg/dL (ref 6–20)
CALCIUM: 9.6 mg/dL (ref 8.9–10.3)
CO2: 23 mmol/L (ref 22–32)
Chloride: 95 mmol/L — ABNORMAL LOW (ref 101–111)
Creatinine, Ser: 0.96 mg/dL (ref 0.44–1.00)
GFR calc non Af Amer: >60 mL/min (ref >60)
GLUCOSE: 138 mg/dL — AB (ref 65–99)
POTASSIUM: 4.3 mmol/L (ref 3.5–5.1)
Sodium: 134 mmol/L — ABNORMAL LOW (ref 135–145)

## 2017-08-31 LAB — I-STAT BETA HCG BLOOD, ED (MC, WL, AP ONLY): I-stat hCG, quantitative: <5 m[IU]/mL (ref <5)

## 2017-08-31 LAB — CBC
HEMATOCRIT: 30.2 % — AB (ref 36.0–46.0)
HEMOGLOBIN: 8.8 g/dL — AB (ref 12.0–15.0)
MCH: 25.4 pg — ABNORMAL LOW (ref 26.0–34.0)
MCHC: 29.1 g/dL — ABNORMAL LOW (ref 30.0–36.0)
MCV: 87 fL (ref 78.0–100.0)
Platelets: 483 10*3/uL — ABNORMAL HIGH (ref 150–400)
RBC: 3.47 MIL/uL — AB (ref 3.87–5.11)
RDW: 17 % — ABNORMAL HIGH (ref 11.5–15.5)
WBC: 17.5 10*3/uL — AB (ref 4.0–10.5)

## 2017-08-31 NOTE — ED Triage Notes (Addendum)
Reports having heavy vaginal bleeding for a month.  Reports feeling dizzy today around 1630 and passed out hitting a wall then the floor at a restaurant.  Having pelvic pain at this time.  Neuro check wnl.  Also reports being started on metoprolol and hctz recently.

## 2017-09-01 ENCOUNTER — Emergency Department (HOSPITAL_COMMUNITY)
Admission: EM | Admit: 2017-09-01 | Discharge: 2017-09-01 | Disposition: A | Discharge status: Home / Self Care | Payer: PRIVATE HEALTH INSURANCE | Attending: Emergency Medicine | Admitting: Emergency Medicine

## 2017-09-01 NOTE — ED Notes (Signed)
No answer in waiting area.

## 2017-09-01 NOTE — ED Notes (Signed)
No answer for treatment room. 

## 2019-01-08 ENCOUNTER — Ambulatory Visit
Admission: EM | Admit: 2019-01-08 | Discharge: 2019-01-08 | Disposition: A | Discharge status: Home / Self Care | Payer: 59

## 2019-01-08 ENCOUNTER — Other Ambulatory Visit: Payer: Self-pay

## 2019-01-08 DIAGNOSIS — S21001A Unspecified open wound of right breast, initial encounter: Secondary | ICD-10-CM | POA: Diagnosis not present

## 2019-01-08 MED ORDER — MUPIROCIN CALCIUM 2 % EX CREA: 1.0000 "application " | TOPICAL_CREAM | Freq: Two times a day (BID) | CUTANEOUS | 0 refills | Status: DC

## 2019-01-08 NOTE — ED Triage Notes (Signed)
Pt has insect bite under right breast, white patch with bulseye

## 2019-01-08 NOTE — ED Provider Notes (Signed)
[facility]   [id number] 01/08/19 Arrival Time: 1644  CC: Insect bite  SUBJECTIVE:  Trula SladeRachael Mault is a 43 y.o. female hx significant for anemia, who presents with a possible insect bite under RT breast that occurred 4 days ago.  Denies seeing an insect bite her breast or in her clothing.  Had a video visit and they had recommended she be evaluated in person for possible incision and drainage.  Localizes the wound to underneath RT breast.  Describes it as painful, and red with possible discharge.  Has tried using peroxide without relief.  Symptoms are made worse to the touch.  Reports similar symptoms in the past related to spider bite on her right arm, states it was incised and drained at that time.  Complains of mild erythema, swelling, and possible discharge. Denies fever, chills, nausea, vomiting, SOB, chest pain, abdominal pain, changes in bowel or bladder function.    ROS: As per HPI.  All other pertinent ROS negative.     Past Medical History:  Diagnosis Date  . Anemia    with present bleeding vaginally  . PONV (postoperative nausea and vomiting)    Past Surgical History:  Procedure Laterality Date  . CESAREAN SECTION  2005, 2009  . CHOLECYSTECTOMY  2006  . HERNIA REPAIR  2012   umbilical and abdominal repairs  . HYSTEROSCOPY W/D&C N/A 03/06/2013   Procedure: DILATATION AND CURETTAGE /HYSTEROSCOPY;  Surgeon: Geryl RankinsEvelyn Varnado, MD;  Location: [facility];  Service: Gynecology;  Laterality: N/A;  . TUBAL LIGATION     2009   Allergies  Allergen Reactions  . Hydromorphone Nausea And Vomiting  . Latex Rash   No current facility-administered medications on file prior to encounter.    Current Outpatient Medications on File Prior to Encounter  Medication Sig Dispense Refill  . escitalopram (LEXAPRO) 10 MG tablet Take 10 mg by mouth daily.    Marland Kitchen. ibuprofen (ADVIL,MOTRIN) 800 MG tablet Take 800 mg by mouth every 8 (eight) hours as needed for pain.    . metFORMIN (GLUCOPHAGE) 1000  MG tablet Take 1,000 mg by mouth daily.    . naproxen (NAPROSYN) 250 MG tablet Take 500 mg by mouth daily as needed.    . [DISCONTINUED] medroxyPROGESTERone (PROVERA) 10 MG tablet Take 30 mg by mouth daily. Pt was taking 10mg  daily, then 20mg  daily, now 30 mg daily     Social History   Socioeconomic History  . Marital status: Married    Spouse name: Not on file  . Number of children: Not on file  . Years of education: Not on file  . Highest education level: Not on file  Occupational History  . Not on file  Social Needs  . Financial resource strain: Not on file  . Food insecurity    Worry: Not on file    Inability: Not on file  . Transportation needs    Medical: Not on file    Non-medical: Not on file  Tobacco Use  . Smoking status: Never Smoker  . Smokeless tobacco: Never Used  Substance and Sexual Activity  . Alcohol use: Yes  . Drug use: No  . Sexual activity: Yes    Birth control/protection: Surgical  Lifestyle  . Physical activity    Days per week: Not on file    Minutes per session: Not on file  . Stress: Not on file  Relationships  . Social Musicianconnections    Talks on phone: Not on file    Gets together: Not  on file    Attends religious service: Not on file    Active member of club or organization: Not on file    Attends meetings of clubs or organizations: Not on file    Relationship status: Not on file  . Intimate partner violence    Fear of current or ex partner: Not on file    Emotionally abused: Not on file    Physically abused: Not on file    Forced sexual activity: Not on file  Other Topics Concern  . Not on file  Social History Narrative  . Not on file   Family History  Problem Relation Age of Onset  . Heart failure Mother   . Cancer Mother   . Cancer Father   . Diabetes Father     OBJECTIVE: Vitals:   01/08/19 1659  BP: (!) 159/105  Pulse: (!) 110  Resp: 20  Temp: 97.8 F (36.6 C)    General appearance: alert; no distress Head: NCAT  Lungs: clear to auscultation bilaterally Heart: regular rate and rhythm.  Extremities: no edema Skin: warm and dry; small superficial wound with yellow base less than 0.5 cm in diameter localized underneath skin fold of RT breast with mild erythema, mildly TTP, no obvious drainage or bleeding Psychological: alert and cooperative; normal mood and affect  ASSESSMENT & PLAN:  1. Wound of right breast, initial encounter     Meds ordered this encounter  Medications  . mupirocin cream (BACTROBAN) 2 %    Sig: Apply 1 application topically 2 (two) times daily.    Dispense:  15 g    Refill:  0    Order Specific Question:   Supervising Provider    Answer:   Raylene Everts [[id number]]    Wash site daily with warm water and mild soap Keep covered to avoid friction Use baby powder to help with moisture Bactroban ointment prescribed.  Use as directed to help prevent infection Return or go to the ED if you have any new or worsening symptoms fever, chills, nausea, vomiting, increased redness, swelling, discharge, etc...  Reviewed expectations re: course of current medical issues. Questions answered. Outlined signs and symptoms indicating need for more acute intervention. Patient verbalized understanding. After Visit Summary given.   Lestine Box, PA-C 01/08/19 2144

## 2019-01-08 NOTE — Discharge Instructions (Signed)
Wash site daily with warm water and mild soap Keep covered to avoid friction Use baby powder to help with absorption Bactroban ointment prescribed.  Use as directed to help prevent infection Return or go to the ED if you have any new or worsening symptoms fever, chills, nausea, vomiting, increased redness, swelling, discharge, etc..Marland Kitchen

## 2019-01-09 ENCOUNTER — Telehealth: Payer: Self-pay | Admitting: Emergency Medicine

## 2019-01-09 MED ORDER — MUPIROCIN 2 % EX OINT: 1.0000 "application " | TOPICAL_OINTMENT | Freq: Two times a day (BID) | CUTANEOUS | 0 refills | Status: AC

## 2019-01-09 NOTE — Telephone Encounter (Signed)
Insurance does not cover bactroban cream, will send in ointment

## 2019-08-24 ENCOUNTER — Ambulatory Visit: Payer: 59 | Attending: Internal Medicine

## 2019-08-24 DIAGNOSIS — Z23 Encounter for immunization: Secondary | ICD-10-CM

## 2019-08-24 NOTE — Progress Notes (Signed)
   Covid-19 Vaccination Clinic  Name:  Candice Gillespie    MRN: [id number] DOB: 1975-11-15  08/24/2019  [name] was observed post Covid-19 immunization for 15 minutes without incident. She was provided with Vaccine Information Sheet and instruction to access the V-Safe system.   [name] was instructed to call 911 with any severe reactions post vaccine: Marland Kitchen Difficulty breathing  . Swelling of face and throat  . A fast heartbeat  . A bad rash all over body  . Dizziness and weakness   Immunizations Administered    Name Date Dose VIS Date Route   Moderna COVID-19 Vaccine 08/24/2019  9:35 AM 0.5 mL 05/08/2019 Intramuscular   Manufacturer: Moderna   Lot: [id number]   NDC: 09295-747-34

## 2019-09-25 ENCOUNTER — Ambulatory Visit: Payer: 59 | Attending: Internal Medicine

## 2019-09-25 DIAGNOSIS — Z23 Encounter for immunization: Secondary | ICD-10-CM

## 2019-09-25 NOTE — Progress Notes (Signed)
   Covid-19 Vaccination Clinic  Name:  Candice Gillespie    MRN: [id number] DOB: Oct 17, 1975  09/25/2019  [name] was observed post Covid-19 immunization for 15 minutes without incident. She was provided with Vaccine Information Sheet and instruction to access the V-Safe system.   [name] was instructed to call 911 with any severe reactions post vaccine: Marland Kitchen Difficulty breathing  . Swelling of face and throat  . A fast heartbeat  . A bad rash all over body  . Dizziness and weakness   Immunizations Administered    Name Date Dose VIS Date Route   Moderna COVID-19 Vaccine 09/25/2019  9:15 AM 0.5 mL 05/2019 Intramuscular   Manufacturer: Moderna   Lot: [id number]   NDC: 15056-979-48

## 2019-09-25 NOTE — Progress Notes (Signed)
   [facility] Vaccination [facility]  Name:  Candice Gillespie    MRN: [id number] DOB: 04/05/1976  09/25/2019  Candice Gillespie was observed post Covid-19 immunization for 15 minutes without incident. She was provided with Vaccine Information Sheet and instruction to access the V-Safe system.   Candice Gillespie was instructed to call 911 with any severe reactions post vaccine: . Difficulty breathing  . Swelling of face and throat  . A fast heartbeat  . A bad rash all over body  . Dizziness and weakness   Immunizations Administered    Name Date Dose VIS Date Route   Moderna COVID-19 Vaccine 09/25/2019  9:15 AM 0.5 mL 05/2019 Intramuscular   Manufacturer: Moderna   Lot: [id number]   NDC: 80777-273-99     

## 2021-03-06 ENCOUNTER — Other Ambulatory Visit: Payer: Self-pay | Admitting: Internal Medicine

## 2021-03-06 DIAGNOSIS — Z139 Encounter for screening, unspecified: Secondary | ICD-10-CM

## 2021-03-17 ENCOUNTER — Other Ambulatory Visit: Payer: Self-pay

## 2021-03-17 ENCOUNTER — Ambulatory Visit
Admission: RE | Admit: 2021-03-17 | Discharge: 2021-03-17 | Disposition: A | Discharge status: Home / Self Care | Payer: 59 | Source: Ambulatory Visit | Attending: Internal Medicine | Admitting: Internal Medicine

## 2021-03-17 DIAGNOSIS — Z139 Encounter for screening, unspecified: Secondary | ICD-10-CM

## 2022-04-14 ENCOUNTER — Other Ambulatory Visit: Payer: Self-pay | Admitting: Internal Medicine

## 2022-04-14 DIAGNOSIS — Z1231 Encounter for screening mammogram for malignant neoplasm of breast: Secondary | ICD-10-CM

## 2023-02-04 IMAGING — MG MM DIGITAL SCREENING BILAT W/ TOMO AND CAD
6 of 10 series · 6 of 30 positions shown · non-contrast
Comparison: Previous exam(s).

CLINICAL DATA: Screening.

EXAM:
DIGITAL SCREENING BILATERAL MAMMOGRAM WITH TOMOSYNTHESIS AND CAD
TECHNIQUE: Bilateral screening digital craniocaudal and mediolateral oblique
mammograms were obtained. Bilateral screening digital breast
tomosynthesis was performed. The images were evaluated with
computer-aided detection.

[R MLO synth-2D]
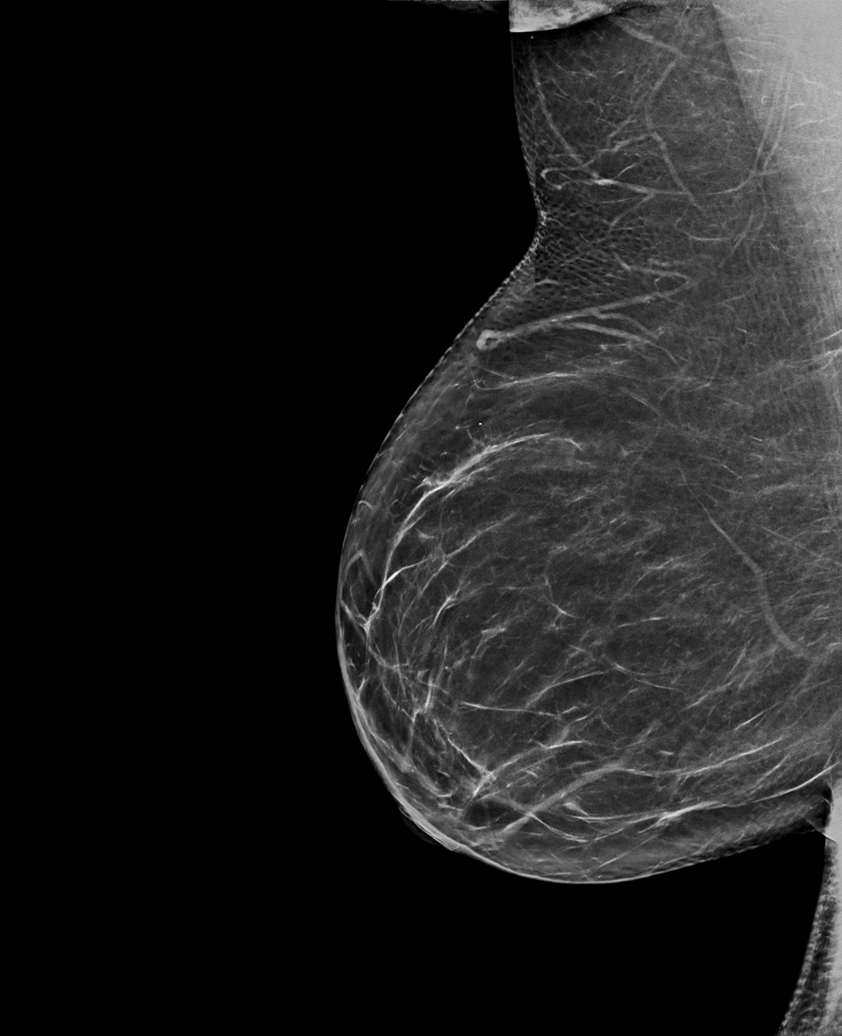

[R CC synth-2D]
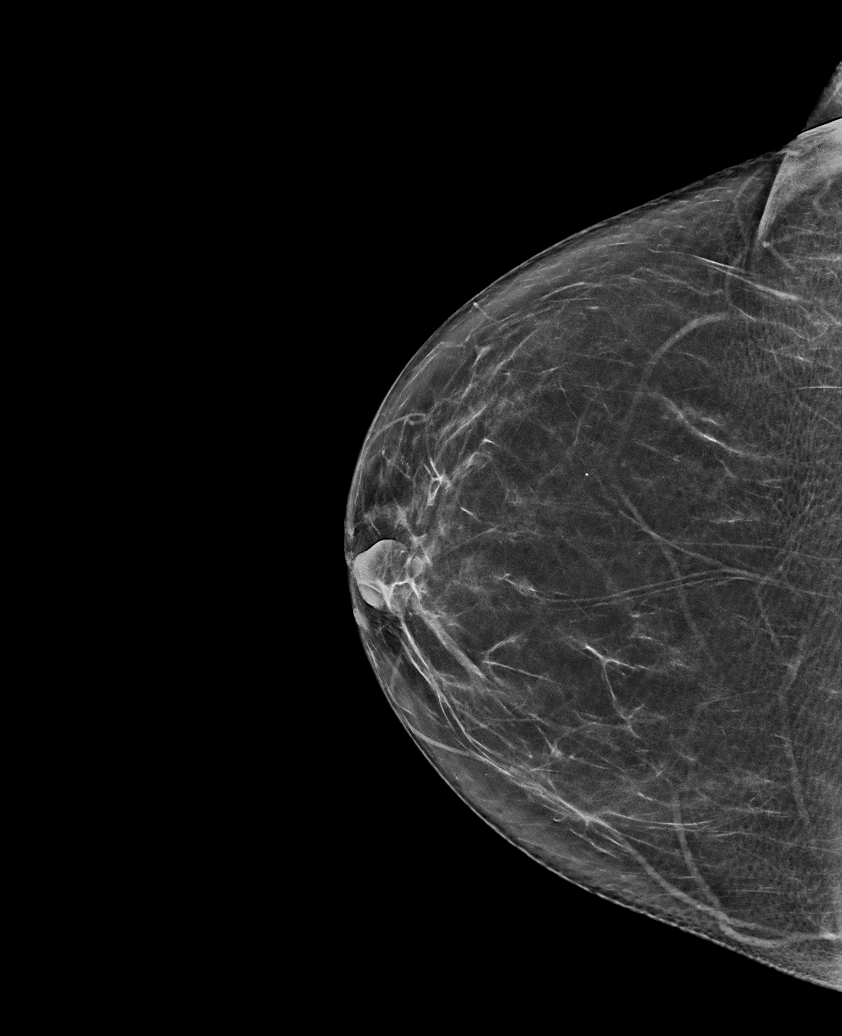

[L CC synth-2D (1 of 2)]
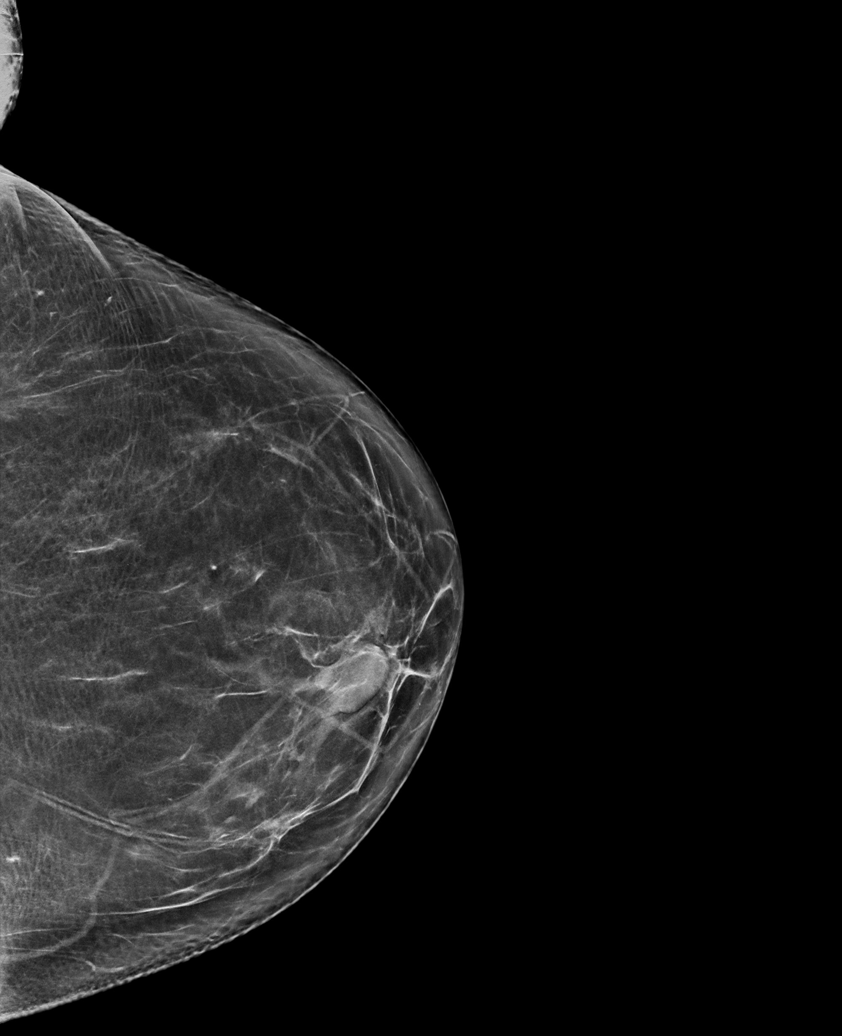

[L CC synth-2D (2 of 2)]
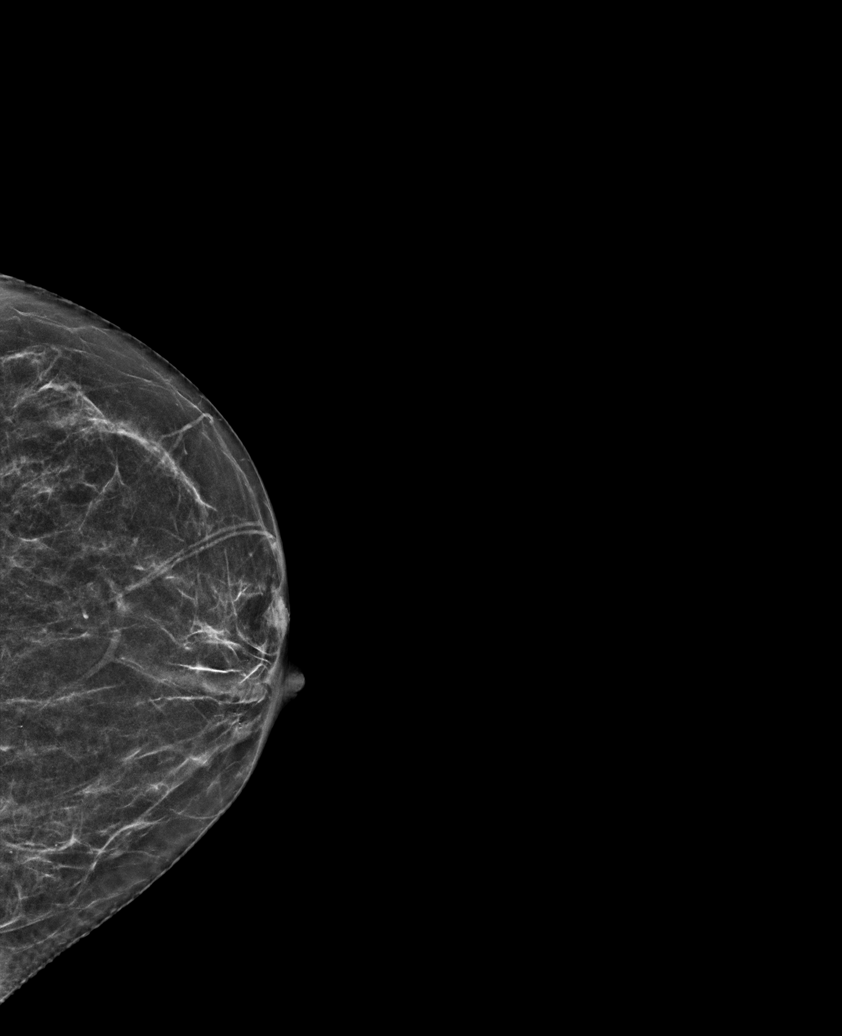

[L MLO synth-2D]
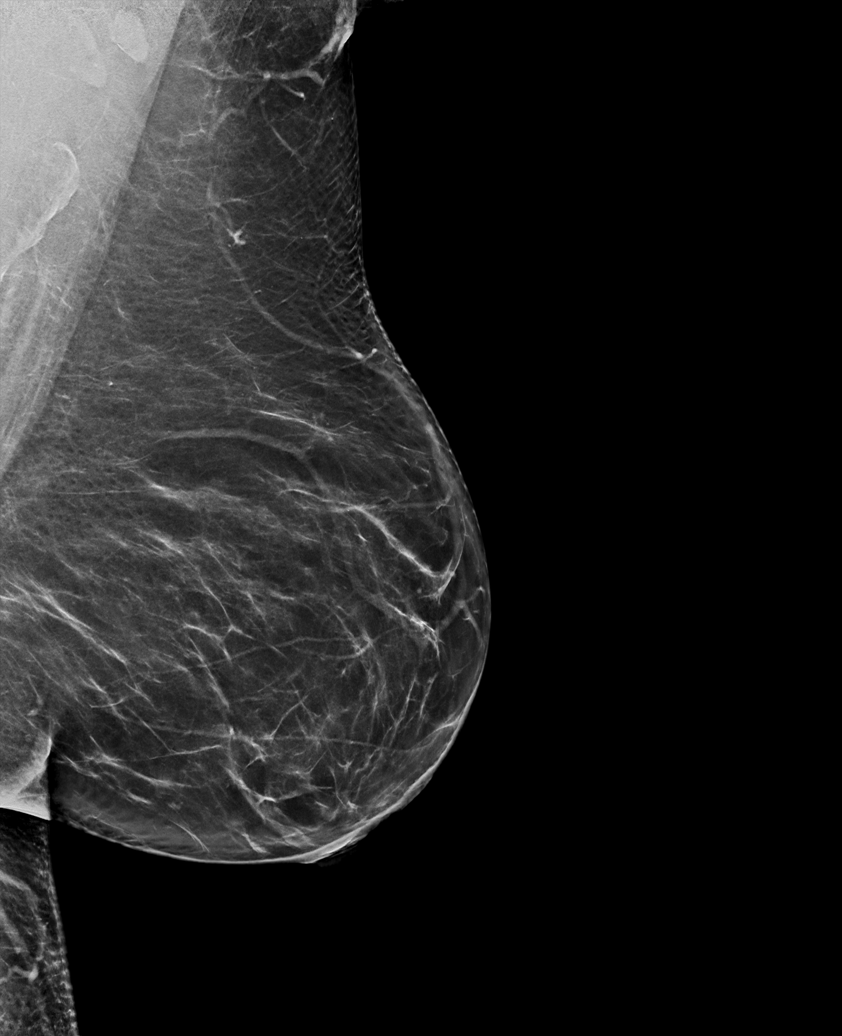

[L CC tomo · tomo slice 37/73.0]
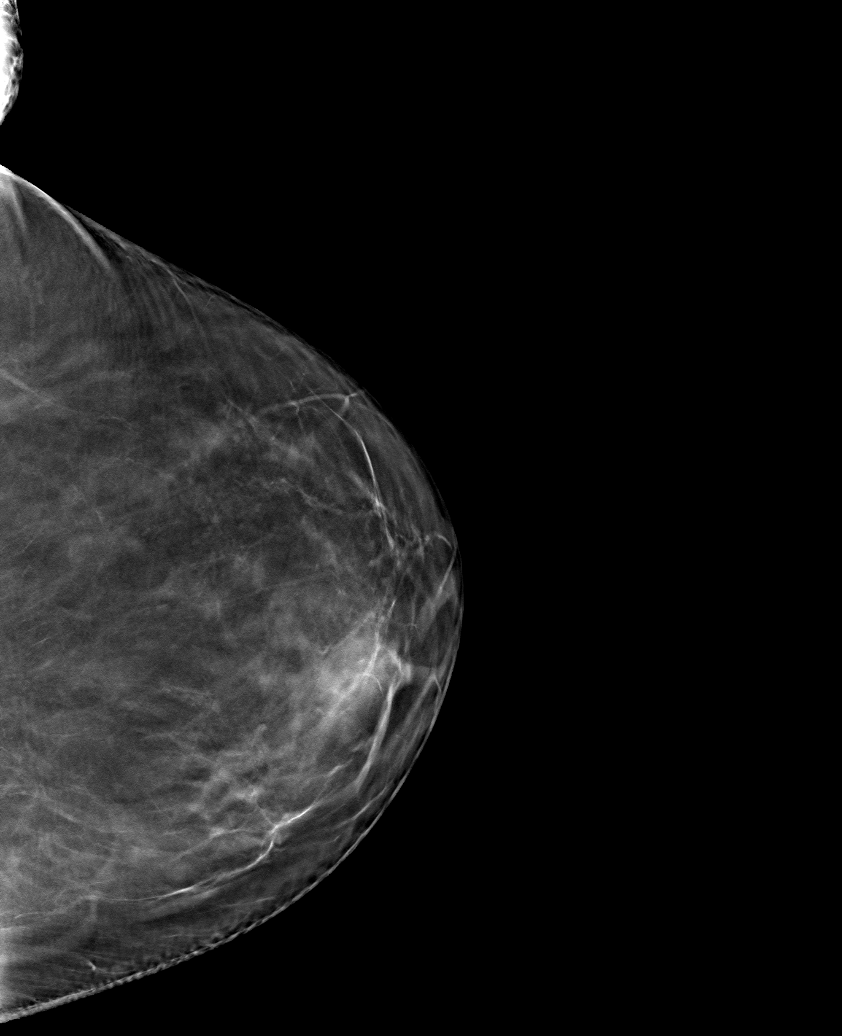

[6 of 30 positions shown; findings below may reference images not displayed]

ACR Breast Density Category b: There are scattered areas of
fibroglandular density.
FINDINGS: There are no findings suspicious for malignancy.
IMPRESSION: No mammographic evidence of malignancy. A result letter of this
screening mammogram will be mailed directly to the patient.

RECOMMENDATION:
Screening mammogram in one year. (Code:51-O-LD2)

BI-RADS CATEGORY  1: Negative.

## 2023-03-15 ENCOUNTER — Ambulatory Visit
Admission: RE | Admit: 2023-03-15 | Discharge: 2023-03-15 | Disposition: A | Discharge status: Home / Self Care | Payer: 59 | Source: Ambulatory Visit | Attending: Family Medicine | Admitting: Family Medicine

## 2023-03-15 DIAGNOSIS — Z1231 Encounter for screening mammogram for malignant neoplasm of breast: Secondary | ICD-10-CM

## 2023-03-18 ENCOUNTER — Other Ambulatory Visit: Payer: Self-pay | Admitting: Internal Medicine

## 2023-03-18 DIAGNOSIS — R928 Other abnormal and inconclusive findings on diagnostic imaging of breast: Secondary | ICD-10-CM

## 2023-03-22 ENCOUNTER — Other Ambulatory Visit: Payer: Self-pay | Admitting: Internal Medicine

## 2023-03-22 ENCOUNTER — Ambulatory Visit
Admission: RE | Admit: 2023-03-22 | Discharge: 2023-03-22 | Disposition: A | Discharge status: Home / Self Care | Payer: 59 | Source: Ambulatory Visit | Attending: Internal Medicine | Admitting: Internal Medicine

## 2023-03-22 DIAGNOSIS — N63 Unspecified lump in unspecified breast: Secondary | ICD-10-CM

## 2023-03-22 DIAGNOSIS — R928 Other abnormal and inconclusive findings on diagnostic imaging of breast: Secondary | ICD-10-CM

## 2023-03-23 ENCOUNTER — Inpatient Hospital Stay
Admission: RE | Admit: 2023-03-23 | Discharge: 2023-03-23 | Discharge status: Home / Self Care | Payer: 59 | Source: Ambulatory Visit | Attending: Internal Medicine | Admitting: Internal Medicine

## 2023-03-23 ENCOUNTER — Ambulatory Visit
Admission: RE | Admit: 2023-03-23 | Discharge: 2023-03-23 | Disposition: A | Discharge status: Home / Self Care | Payer: 59 | Source: Ambulatory Visit | Attending: Internal Medicine | Admitting: Internal Medicine

## 2023-03-23 DIAGNOSIS — N63 Unspecified lump in unspecified breast: Secondary | ICD-10-CM

## 2023-03-23 HISTORY — PX: BREAST BIOPSY: SHX20

## 2023-03-24 LAB — SURGICAL PATHOLOGY
